# Patient Record
Sex: Female | Born: 2000 | Race: Black or African American | Hispanic: No | Marital: Single | Smoking: Never smoker
Health system: Southern US, Community
[De-identification: ages and names within clinical notes are randomized; demographics above are authoritative.]

## PROBLEM LIST (undated history)

## (undated) HISTORY — PX: ADENOIDECTOMY: SUR15

## (undated) HISTORY — PX: TONSILLECTOMY: SUR1361

---
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
## 2020-03-26 DIAGNOSIS — N951 Menopausal and female climacteric states: Secondary | ICD-10-CM | POA: Diagnosis not present

## 2020-03-26 DIAGNOSIS — E8881 Metabolic syndrome: Secondary | ICD-10-CM | POA: Diagnosis not present

## 2020-03-26 DIAGNOSIS — R635 Abnormal weight gain: Secondary | ICD-10-CM | POA: Diagnosis not present

## 2020-03-26 DIAGNOSIS — E559 Vitamin D deficiency, unspecified: Secondary | ICD-10-CM | POA: Diagnosis not present

## 2020-04-05 DIAGNOSIS — E8881 Metabolic syndrome: Secondary | ICD-10-CM | POA: Diagnosis not present

## 2020-04-05 DIAGNOSIS — Z1331 Encounter for screening for depression: Secondary | ICD-10-CM | POA: Diagnosis not present

## 2020-04-05 DIAGNOSIS — Z1339 Encounter for screening examination for other mental health and behavioral disorders: Secondary | ICD-10-CM | POA: Diagnosis not present

## 2020-04-05 DIAGNOSIS — E559 Vitamin D deficiency, unspecified: Secondary | ICD-10-CM | POA: Diagnosis not present

## 2020-04-05 DIAGNOSIS — R7303 Prediabetes: Secondary | ICD-10-CM | POA: Diagnosis not present

## 2020-04-05 DIAGNOSIS — N951 Menopausal and female climacteric states: Secondary | ICD-10-CM | POA: Diagnosis not present

## 2020-04-25 DIAGNOSIS — R7303 Prediabetes: Secondary | ICD-10-CM | POA: Diagnosis not present

## 2020-04-25 DIAGNOSIS — Z6841 Body Mass Index (BMI) 40.0 and over, adult: Secondary | ICD-10-CM | POA: Diagnosis not present
